# Patient Record
Sex: Female | Born: 2005 | Race: Black or African American | Hispanic: No | Marital: Single | Smoking: Never smoker
Health system: Southern US, Community
[De-identification: ages and names within clinical notes are randomized; demographics above are authoritative.]

## PROBLEM LIST (undated history)

## (undated) HISTORY — PX: WISDOM TOOTH EXTRACTION: SHX21

## (undated) HISTORY — PX: APPENDECTOMY: SHX54

---
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
## 2024-01-10 ENCOUNTER — Emergency Department
Admission: EM | Admit: 2024-01-10 | Discharge: 2024-01-10 | Discharge status: Against Medical Advice | Payer: MEDICAID | Attending: Emergency Medicine | Admitting: Emergency Medicine

## 2024-01-10 ENCOUNTER — Other Ambulatory Visit: Payer: Self-pay

## 2024-01-10 ENCOUNTER — Encounter: Payer: Self-pay | Admitting: Emergency Medicine

## 2024-01-10 DIAGNOSIS — Y92410 Unspecified street and highway as the place of occurrence of the external cause: Secondary | ICD-10-CM | POA: Insufficient documentation

## 2024-01-10 DIAGNOSIS — Z5321 Procedure and treatment not carried out due to patient leaving prior to being seen by health care provider: Secondary | ICD-10-CM | POA: Diagnosis not present

## 2024-01-10 DIAGNOSIS — S0992XA Unspecified injury of nose, initial encounter: Secondary | ICD-10-CM | POA: Insufficient documentation
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
## 2024-01-22 ENCOUNTER — Ambulatory Visit: Payer: MEDICAID

## 2024-01-22 VITALS — BP 131/81 | Ht 61.0 in | Wt 147.5 lb

## 2024-01-22 DIAGNOSIS — Z3009 Encounter for other general counseling and advice on contraception: Secondary | ICD-10-CM

## 2024-01-22 DIAGNOSIS — Z30013 Encounter for initial prescription of injectable contraceptive: Secondary | ICD-10-CM | POA: Diagnosis not present

## 2024-01-22 DIAGNOSIS — Z309 Encounter for contraceptive management, unspecified: Secondary | ICD-10-CM | POA: Diagnosis not present

## 2024-01-22 DIAGNOSIS — Z3042 Encounter for surveillance of injectable contraceptive: Secondary | ICD-10-CM

## 2024-01-22 MED ORDER — MEDROXYPROGESTERONE ACETATE 150 MG/ML IM SUSP: 150.0000 mg | Freq: Once | INTRAMUSCULAR | Status: DC

## 2024-01-22 NOTE — Progress Notes (Signed)
 11weeks 3days post last depo. Voices no concerns. Spotting noted between injections. Depo given today per order TERRACE KYM Helling, MD. Depo given R. Delt today.Next depo due 04/08/24. Advised pt also due for PE when next depo due and appt reminder card given to schedule Annual exam and depo.

## 2024-01-28 ENCOUNTER — Ambulatory Visit (HOSPITAL_COMMUNITY)
Admission: EM | Admit: 2024-01-28 | Discharge: 2024-01-28 | Disposition: A | Discharge status: Home / Self Care | Payer: MEDICAID | Attending: Internal Medicine | Admitting: Internal Medicine

## 2024-01-28 ENCOUNTER — Encounter (HOSPITAL_COMMUNITY): Payer: Self-pay

## 2024-01-28 DIAGNOSIS — J029 Acute pharyngitis, unspecified: Secondary | ICD-10-CM

## 2024-01-28 DIAGNOSIS — N3001 Acute cystitis with hematuria: Secondary | ICD-10-CM

## 2024-01-28 DIAGNOSIS — R350 Frequency of micturition: Secondary | ICD-10-CM | POA: Diagnosis not present

## 2024-01-28 DIAGNOSIS — Z3202 Encounter for pregnancy test, result negative: Secondary | ICD-10-CM

## 2024-01-28 DIAGNOSIS — J069 Acute upper respiratory infection, unspecified: Secondary | ICD-10-CM

## 2024-01-28 LAB — POCT URINE DIPSTICK
Glucose, UA: NEGATIVE mg/dL
Nitrite, UA: NEGATIVE
POC PROTEIN,UA: 30 — AB
Spec Grav, UA: >=1.03 — AB (ref 1.010–1.025)
Urobilinogen, UA: 1 U/dL
pH, UA: 5.5 (ref 5.0–8.0)

## 2024-01-28 LAB — POCT URINE PREGNANCY: Preg Test, Ur: NEGATIVE

## 2024-01-28 LAB — POCT RAPID STREP A (OFFICE): Rapid Strep A Screen: NEGATIVE

## 2024-01-28 MED ORDER — GUAIFENESIN ER 1200 MG PO TB12: 1200.0000 mg | ORAL_TABLET | Freq: Two times a day (BID) | ORAL | 0 refills | Status: AC

## 2024-01-28 MED ORDER — PROMETHAZINE-DM 6.25-15 MG/5ML PO SYRP: 5.0000 mL | ORAL_SOLUTION | Freq: Every evening | ORAL | 0 refills | Status: AC | PRN

## 2024-01-28 MED ORDER — ONDANSETRON 4 MG PO TBDP: 4.0000 mg | ORAL_TABLET | Freq: Three times a day (TID) | ORAL | 0 refills | Status: AC | PRN

## 2024-01-28 MED ORDER — CEPHALEXIN 500 MG PO CAPS: 500.0000 mg | ORAL_CAPSULE | Freq: Two times a day (BID) | ORAL | 0 refills | Status: AC

## 2024-01-28 NOTE — ED Triage Notes (Addendum)
 Presenting with throat pain, runny nose, sneezing, chills, nausea, and fever onset 1 week ago. Having headaches as well. No known sick exposure. States the fever mainly started 3 days ago, will reduce with meds but comes back as the medication wears off.   Patient tried Nyquil, Mucinex  with slight relief.   Patient states for the last month having urinary urgency with reduced output. Denies any pain with urination.

## 2024-01-28 NOTE — ED Provider Notes (Signed)
 [facility]    CSN: [id number] Arrival date & time: 01/28/24  1113      History   Chief Complaint Chief Complaint  Patient presents with  . Nasal Congestion  . Nausea    HPI Haley Harrison is a 18 y.o. female.   Haley Harrison is a 18 y.o. female presenting for chief complaint of sore throat,    Urinary frequency, urinary urgency, and sensation of incomplete bladder emptying with poor urine output with each void started 2 weeks ago. Denies dysuria, gross hematuria.  Interimttent nausea without vomiting.  Denies back pain or flank pain or vomiting  Fever/chills started 3 days ago  Sroe throat started a week ago with nasal congestion        History reviewed. No pertinent past medical history.  Patient Active Problem List   Diagnosis Date Noted  . Family planning 11/03/2023  . Acute appendicitis with generalized peritonitis 07/18/2013  . Acute respiratory failure (HCC) 07/18/2013  . Hypoxia 07/18/2013  . High risk for sepsis 07/18/2013  . Perforated appendix 07/17/2013    Past Surgical History:  Procedure Laterality Date  . APPENDECTOMY    . LAPAROSCOPIC APPENDECTOMY N/A 07/17/2013   Procedure: APPENDECTOMY LAPAROSCOPIC;  Surgeon: CHRISTELLA. Julietta Millman, MD;  Location: [facility] OR;  Service: Pediatrics;  Laterality: N/A;  . WISDOM TOOTH EXTRACTION      OB History     Gravida  0   Para  0   Term  0   Preterm  0   AB  0   Living  0      SAB  0   IAB  0   Ectopic  0   Multiple  0   Live Births  0            Home Medications    Prior to Admission medications   Medication Sig Start Date End Date Taking? Authorizing Provider  cephALEXin  (KEFLEX ) 500 MG capsule Take 1 capsule (500 mg total) by mouth 2 (two) times daily for 7 days. 01/28/24 02/04/24 Yes Enedelia Dorna CHRISTELLA, FNP  Guaifenesin  1200 MG TB12 Take 1 tablet (1,200 mg total) by mouth in the morning and at bedtime. 01/28/24  Yes Enedelia Dorna CHRISTELLA, FNP   medroxyPROGESTERone  (DEPO-PROVERA ) 150 MG/ML injection Inject 150 mg into the muscle every 3 (three) months.   Yes [provider]  ondansetron  (ZOFRAN -ODT) 4 MG disintegrating tablet Take 1 tablet (4 mg total) by mouth every 8 (eight) hours as needed for nausea or vomiting. 01/28/24  Yes Enedelia Dorna CHRISTELLA, FNP  promethazine -dextromethorphan (PROMETHAZINE -DM) 6.25-15 MG/5ML syrup Take 5 mLs by mouth at bedtime as needed for cough. 01/28/24  Yes Enedelia Dorna CHRISTELLA, FNP  EPINEPHrine  0.3 mg/0.3 mL IJ SOAJ injection Inject 0.3 mg into the muscle as needed for anaphylaxis. 04/27/23   Vonna Sharlet POUR, MD    Family History Family History  Problem Relation Age of Onset  . Healthy Mother   . Asthma Mother   . Healthy Father   . Asthma Brother   . Diabetes Mellitus II Maternal Grandmother   . Asthma Maternal Grandmother   . Breast cancer Maternal Grandmother   . Stroke Maternal Grandmother   . Parkinson's disease Maternal Grandfather     Social History Social History   Tobacco Use  . Smoking status: Never  . Smokeless tobacco: Never  Vaping Use  . Vaping status: Never Used  Substance Use Topics  . Alcohol use: Never  . Drug use: Never  Allergies   Peanut-containing drug products, Peanut allergen powder-dnfp, and Shrimp extract   Review of Systems Review of Systems   Physical Exam Triage Vital Signs ED Triage Vitals  Encounter Vitals Group     BP 01/28/24 1210 111/75     Girls Systolic BP Percentile --      Girls Diastolic BP Percentile --      Boys Systolic BP Percentile --      Boys Diastolic BP Percentile --      Pulse Rate 01/28/24 1210 77     Resp 01/28/24 1210 17     Temp 01/28/24 1210 100 F (37.8 C)     Temp Source 01/28/24 1210 Oral     SpO2 01/28/24 1210 96 %     Weight 01/28/24 1209 148 lb (67.1 kg)     Height 01/28/24 1209 5' 1 (1.549 m)     Head Circumference --      Peak Flow --      Pain Score 01/28/24 1208 8     Pain Loc --       Pain Education --      Exclude from Growth Chart --    No data found.  Updated Vital Signs BP 111/75 (BP Location: Right Arm)   Pulse 77   Temp 100 F (37.8 C) (Oral) Comment: around 0300 took cold medication  Resp 17   Ht 5' 1 (1.549 m)   Wt 148 lb (67.1 kg)   LMP  (LMP Unknown)   SpO2 96%   BMI 27.96 kg/m   Visual Acuity Right Eye Distance:   Left Eye Distance:   Bilateral Distance:    Right Eye Near:   Left Eye Near:    Bilateral Near:     Physical Exam   UC Treatments / Results  Labs (all labs ordered are listed, but only abnormal results are displayed) Labs Reviewed  POCT URINE DIPSTICK - Abnormal; Notable for the following components:      Result Value   Clarity, UA turbid (*)    Bilirubin, UA small (*)    Ketones, POC UA trace (5) (*)    Spec Grav, UA >=1.030 (*)    Blood, UA trace-intact (*)    POC PROTEIN,UA =30 (*)    Leukocytes, UA Small (1+) (*)    All other components within normal limits  URINE CULTURE  POCT RAPID STREP A (OFFICE)  POCT URINE PREGNANCY    EKG   Radiology No results found.  Procedures Procedures (including critical care time)  Medications Ordered in UC Medications - No data to display  Initial Impression / Assessment and Plan / UC Course  I have reviewed the triage vital signs and the nursing notes.  Pertinent labs & imaging results that were available during my care of the patient were reviewed by me and considered in my medical decision making (see chart for details).     *** Final Clinical Impressions(s) / UC Diagnoses   Final diagnoses:  Sore throat  Urinary frequency  Acute cystitis with hematuria     Discharge Instructions      You have a viral illness which will improve on its own with rest, fluids, and medications to help with your symptoms.  Tylenol , guaifenesin  (plain mucinex ), and saline nasal sprays may help relieve symptoms.   Two teaspoons of honey in 1 cup of warm water every 4-6 hours  may help with throat pains.  Humidifier in room at nighttime may help soothe cough (  clean well daily).   Take Promethazine  DM cough medication to help with your cough at nighttime so that you are able to sleep. Do not drive, drink alcohol, or go to work while taking this medication since it can make you sleepy. Only take this at nighttime.   For chest pain, shortness of breath, inability to keep food or fluids down without vomiting, fever that does not respond to tylenol  or motrin , or any other severe symptoms, please go to the ER for further evaluation. Return to urgent care as needed, otherwise follow-up with PCP.    Your urine shows you likely have a urinary tract infection.   I have sent your urine for culture to confirm this.   We will call you if we need to change your antibiotic when we find out the type of bacteria growing in your bladder.  Take antibiotic as directed with a snack/food to avoid stomach upset. To avoid GI upset please take this medication with food.   Avoid drinking beverages that irritate the urinary tract like sodas, tea, coffee, or juice. Drink plenty of water to stay well hydrated and prevent severe infection.  If you develop pain in your upper back on one side, fever despite taking antibiotic, nausea and vomiting where you cannot keep anything down for 24 hours, dizziness/severe headache, or decreased urinary output, please go to the ER. Follow-up with PCP.      ED Prescriptions     Medication Sig Dispense Auth. Provider   cephALEXin  (KEFLEX ) 500 MG capsule Take 1 capsule (500 mg total) by mouth 2 (two) times daily for 7 days. 14 capsule Enedelia Dorna HERO, FNP   Guaifenesin  1200 MG TB12 Take 1 tablet (1,200 mg total) by mouth in the morning and at bedtime. 14 tablet Enedelia Dorna HERO, FNP   promethazine -dextromethorphan (PROMETHAZINE -DM) 6.25-15 MG/5ML syrup Take 5 mLs by mouth at bedtime as needed for cough. 118 mL Enedelia Dorna M, FNP    ondansetron  (ZOFRAN -ODT) 4 MG disintegrating tablet Take 1 tablet (4 mg total) by mouth every 8 (eight) hours as needed for nausea or vomiting. 20 tablet Enedelia Dorna HERO, FNP      PDMP not reviewed this encounter.

## 2024-01-28 NOTE — Discharge Instructions (Addendum)
 You have a viral illness which will improve on its own with rest, fluids, and medications to help with your symptoms.  Tylenol , guaifenesin  (plain mucinex ), and saline nasal sprays may help relieve symptoms.   Two teaspoons of honey in 1 cup of warm water every 4-6 hours may help with throat pains.  Humidifier in room at nighttime may help soothe cough (clean well daily).   Take Promethazine  DM cough medication to help with your cough at nighttime so that you are able to sleep. Do not drive, drink alcohol, or go to work while taking this medication since it can make you sleepy. Only take this at nighttime.   For chest pain, shortness of breath, inability to keep food or fluids down without vomiting, fever that does not respond to tylenol  or motrin , or any other severe symptoms, please go to the ER for further evaluation. Return to urgent care as needed, otherwise follow-up with PCP.    Your urine shows you likely have a urinary tract infection.   I have sent your urine for culture to confirm this.   We will call you if we need to change your antibiotic when we find out the type of bacteria growing in your bladder.  Take antibiotic as directed with a snack/food to avoid stomach upset. To avoid GI upset please take this medication with food.   Avoid drinking beverages that irritate the urinary tract like sodas, tea, coffee, or juice. Drink plenty of water to stay well hydrated and prevent severe infection.  If you develop pain in your upper back on one side, fever despite taking antibiotic, nausea and vomiting where you cannot keep anything down for 24 hours, dizziness/severe headache, or decreased urinary output, please go to the ER. Follow-up with PCP.

## 2024-01-30 ENCOUNTER — Ambulatory Visit (HOSPITAL_COMMUNITY): Payer: Self-pay

## 2024-01-30 LAB — URINE CULTURE: Culture: 40000 — AB

## 2024-03-25 NOTE — Telephone Encounter (Signed)
 Called and lvm to reschedule appointment schedule with Dr. Darral on 4/8 due to cadaver lab that afternoon.  JFink

## 2024-03-28 ENCOUNTER — Institutional Professional Consult (permissible substitution) (INDEPENDENT_AMBULATORY_CARE_PROVIDER_SITE_OTHER): Payer: MEDICAID | Admitting: Physician Assistant

## 2024-04-08 ENCOUNTER — Ambulatory Visit: Payer: MEDICAID
